# Patient Record
Sex: Female | Born: 1950 | Race: White | Hispanic: No | Smoking: Never smoker
Health system: Southern US, Community
[De-identification: ages and names within clinical notes are randomized; demographics above are authoritative.]

## PROBLEM LIST (undated history)

## (undated) DIAGNOSIS — E079 Disorder of thyroid, unspecified: Secondary | ICD-10-CM

## (undated) DIAGNOSIS — J45909 Unspecified asthma, uncomplicated: Secondary | ICD-10-CM

## (undated) HISTORY — PX: ABDOMINAL HYSTERECTOMY: SHX81

## (undated) HISTORY — PX: HERNIA REPAIR: SHX51

---

## 2013-04-26 ENCOUNTER — Other Ambulatory Visit: Payer: Self-pay | Admitting: Family Medicine

## 2013-04-26 ENCOUNTER — Ambulatory Visit
Admission: RE | Admit: 2013-04-26 | Discharge: 2013-04-26 | Disposition: A | Discharge status: Home / Self Care | Payer: BC Managed Care – PPO | Source: Ambulatory Visit | Attending: Family Medicine | Admitting: Family Medicine

## 2013-04-26 DIAGNOSIS — M25512 Pain in left shoulder: Secondary | ICD-10-CM

## 2013-05-09 ENCOUNTER — Ambulatory Visit: Payer: BC Managed Care – PPO

## 2015-04-10 ENCOUNTER — Encounter (HOSPITAL_COMMUNITY): Payer: Self-pay | Admitting: Emergency Medicine

## 2015-04-10 ENCOUNTER — Emergency Department (HOSPITAL_COMMUNITY)
Admission: EM | Admit: 2015-04-10 | Discharge: 2015-04-11 | Disposition: A | Discharge status: Home / Self Care | Payer: Medicare Other | Attending: Emergency Medicine | Admitting: Emergency Medicine

## 2015-04-10 DIAGNOSIS — Z79899 Other long term (current) drug therapy: Secondary | ICD-10-CM | POA: Diagnosis not present

## 2015-04-10 DIAGNOSIS — Y9301 Activity, walking, marching and hiking: Secondary | ICD-10-CM | POA: Diagnosis not present

## 2015-04-10 DIAGNOSIS — J45909 Unspecified asthma, uncomplicated: Secondary | ICD-10-CM | POA: Diagnosis not present

## 2015-04-10 DIAGNOSIS — Z7951 Long term (current) use of inhaled steroids: Secondary | ICD-10-CM | POA: Diagnosis not present

## 2015-04-10 DIAGNOSIS — S2220XA Unspecified fracture of sternum, initial encounter for closed fracture: Secondary | ICD-10-CM | POA: Diagnosis not present

## 2015-04-10 DIAGNOSIS — S299XXA Unspecified injury of thorax, initial encounter: Secondary | ICD-10-CM | POA: Diagnosis present

## 2015-04-10 DIAGNOSIS — Y998 Other external cause status: Secondary | ICD-10-CM | POA: Diagnosis not present

## 2015-04-10 DIAGNOSIS — Y9289 Other specified places as the place of occurrence of the external cause: Secondary | ICD-10-CM | POA: Diagnosis not present

## 2015-04-10 DIAGNOSIS — W108XXA Fall (on) (from) other stairs and steps, initial encounter: Secondary | ICD-10-CM | POA: Diagnosis not present

## 2015-04-10 DIAGNOSIS — Z880 Allergy status to penicillin: Secondary | ICD-10-CM | POA: Diagnosis not present

## 2015-04-10 DIAGNOSIS — E079 Disorder of thyroid, unspecified: Secondary | ICD-10-CM | POA: Insufficient documentation

## 2015-04-10 HISTORY — DX: Disorder of thyroid, unspecified: E07.9

## 2015-04-10 HISTORY — DX: Unspecified asthma, uncomplicated: J45.909

## 2015-04-10 NOTE — ED Notes (Signed)
Pt fell Thursday and thinks she fell on her chest and chin. Fall was mechanical. Says pain has worsened (centralized to middle of chest) since then. Described as "stabbing." Endorses knee pain with abrasion noted. No other c/c. RR even/unlabored.

## 2015-04-11 ENCOUNTER — Emergency Department (HOSPITAL_COMMUNITY): Payer: Medicare Other

## 2015-04-11 ENCOUNTER — Encounter (HOSPITAL_COMMUNITY): Payer: Self-pay

## 2015-04-11 DIAGNOSIS — S2220XA Unspecified fracture of sternum, initial encounter for closed fracture: Secondary | ICD-10-CM | POA: Diagnosis not present

## 2015-04-11 LAB — I-STAT CHEM 8, ED
BUN: 27 mg/dL — ABNORMAL HIGH (ref 6–20)
CALCIUM ION: 1.19 mmol/L (ref 1.13–1.30)
CHLORIDE: 104 mmol/L (ref 101–111)
Creatinine, Ser: 0.7 mg/dL (ref 0.44–1.00)
GLUCOSE: 146 mg/dL — AB (ref 65–99)
HCT: 37 % (ref 36.0–46.0)
Hemoglobin: 12.6 g/dL (ref 12.0–15.0)
Potassium: 4.2 mmol/L (ref 3.5–5.1)
SODIUM: 142 mmol/L (ref 135–145)
TCO2: 25 mmol/L (ref 0–100)

## 2015-04-11 MED ORDER — ONDANSETRON HCL 4 MG/2ML IJ SOLN
4.0000 mg | Freq: Once | INTRAMUSCULAR | Status: AC
  Administered 2015-04-11: 4 mg via INTRAVENOUS
  Filled 2015-04-11: qty 2

## 2015-04-11 MED ORDER — IBUPROFEN 800 MG PO TABS: 800.0000 mg | ORAL_TABLET | Freq: Three times a day (TID) | ORAL | Status: AC | PRN

## 2015-04-11 MED ORDER — SODIUM CHLORIDE 0.9 % IV BOLUS (SEPSIS)
1000.0000 mL | Freq: Once | INTRAVENOUS | Status: AC
  Administered 2015-04-11: 1000 mL via INTRAVENOUS

## 2015-04-11 MED ORDER — OXYCODONE-ACETAMINOPHEN 5-325 MG PO TABS: 1.0000 | ORAL_TABLET | Freq: Four times a day (QID) | ORAL | Status: AC | PRN

## 2015-04-11 MED ORDER — MORPHINE SULFATE (PF) 4 MG/ML IV SOLN
4.0000 mg | Freq: Once | INTRAVENOUS | Status: AC
  Administered 2015-04-11: 4 mg via INTRAVENOUS
  Filled 2015-04-11: qty 1

## 2015-04-11 MED ORDER — HYDROMORPHONE HCL 1 MG/ML IJ SOLN
1.0000 mg | Freq: Once | INTRAMUSCULAR | Status: AC
  Administered 2015-04-11: 1 mg via INTRAVENOUS
  Filled 2015-04-11: qty 1

## 2015-04-11 MED ORDER — IOHEXOL 300 MG/ML  SOLN
75.0000 mL | Freq: Once | INTRAMUSCULAR | Status: AC | PRN
  Administered 2015-04-11: 75 mL via INTRAVENOUS

## 2015-04-11 MED ORDER — DOCUSATE SODIUM 100 MG PO CAPS: 100.0000 mg | ORAL_CAPSULE | Freq: Two times a day (BID) | ORAL | Status: AC

## 2015-04-11 NOTE — ED Provider Notes (Signed)
By signing my name below, I, Budd Palmer, attest that this documentation has been prepared under the direction and in the presence of [employer], DO. Electronically Signed: Budd Palmer, ED Scribe. 04/11/2015. 2:20 AM.  TIME SEEN: 2:06 AM  CHIEF COMPLAINT: Fall, Chest injury  HPI: Adrienne Holder is a 64 y.o. female with a PMHx of asthma and thyroid disease who presents to the Emergency Department complaining of a painful, gradually worsening chest injury sustained after a fall that occurred 8 days ago. Pt states she was walking in the rain down a few steps when she fell forward. She reports she was trying to avoid landing on her left arm due to a PSHx on the rotator cuff, and landed on her chest and chin instead. She states it felt as though her "heart was in [her] lower back." She notes a bystander helped her to stand back up, after which she was a little dizzy, which resolved. She notes she was ambulatory at the time. She reports associated worsening central chest pain, bruising to the upper chest (noticed 5 days ago), and a painful abrasion to the left knee. She notes exacerbation of the chest pain with deep breathing and movement. She also states that she has a PMHx of lower back pain which has been at baseline. She also reports a PMHx of overactive bladder, for which she has to wear pads. Pt denies urinary or bowel incontinence, hematuria, and difficulty urinating. No numbness, tingling or focal weakness. Is able to ambulate. Feels like it hurts to take a deep breath.   Pt is allergic to penicillins and sulfa antibiotics.  ROS: See HPI Constitutional: no fever  Eyes: no drainage  ENT: no runny nose   Cardiovascular:  chest pain  Resp: no SOB  GI: no vomiting GU: no dysuria Integumentary: no rash  Allergy: no hives  Musculoskeletal: no leg swelling  Neurological: no slurred speech ROS otherwise negative  PAST MEDICAL HISTORY/PAST SURGICAL HISTORY:  Past Medical History   Diagnosis Date  . Asthma   . Thyroid disease     MEDICATIONS:  Prior to Admission medications   Medication Sig Start Date End Date Taking? Authorizing Provider  acetaminophen (TYLENOL) 500 MG tablet Take 1,000 mg by mouth every 6 (six) hours as needed for mild pain.   Yes Historical Provider, MD  albuterol (PROVENTIL HFA;VENTOLIN HFA) 108 (90 BASE) MCG/ACT inhaler Inhale 1-2 puffs into the lungs every 6 (six) hours as needed for wheezing or shortness of breath.    Yes Historical Provider, MD  cetirizine (ZYRTEC) 10 MG tablet Take 10 mg by mouth daily.   Yes Historical Provider, MD  citalopram (CELEXA) 40 MG tablet Take 1 tablet by mouth daily. 01/26/15  Yes Historical Provider, MD  fluticasone (FLONASE) 50 MCG/ACT nasal spray Place 2 sprays into both nostrils daily.   Yes Historical Provider, MD  Fluticasone Furoate-Vilanterol (BREO ELLIPTA) 100-25 MCG/INH AEPB Inhale 1 puff into the lungs daily.   Yes Historical Provider, MD  levothyroxine (SYNTHROID, LEVOTHROID) 50 MCG tablet Take 1 tablet by mouth daily. 03/07/15  Yes Historical Provider, MD  vitamin C (ASCORBIC ACID) 500 MG tablet Take 500 mg by mouth daily.   Yes Historical Provider, MD    ALLERGIES:  Allergies  Allergen Reactions  . Penicillins Hives    Has patient had a PCN reaction causing immediate rash, facial/tongue/throat swelling, SOB or lightheadedness with hypotension: YesYes Has patient had a PCN reaction causing severe rash involving mucus membranes or skin necrosis: NoNo  Has patient had a PCN reaction that required hospitalization NoNo Has patient had a PCN reaction occurring within the last 10 years: NoNo If all of the above answers are "NO", then may proceed with Cephalosporin  . Sulfa Antibiotics Hives    SOCIAL HISTORY:  Social History  Substance Use Topics  . Smoking status: Never Smoker   . Smokeless tobacco: Not on file  . Alcohol Use: No    FAMILY HISTORY: History reviewed. No pertinent family  history.  EXAM: BP 134/92 mmHg  Pulse 59  Temp(Src) 98 F (36.7 C) (Oral)  Resp 18  SpO2 95% CONSTITUTIONAL: Alert and oriented and responds appropriately to questions. Well-appearing; well-nourished HEAD: Normocephalic, atraumatic EYES: Conjunctivae clear, PERRL ENT: normal nose; no rhinorrhea; moist mucous membranes; pharynx without lesions noted NECK: Supple, no meningismus, no LAD, no midline spinal tenderness or step-off or deformity  CARD: RRR; S1 and S2 appreciated; no murmurs, no clicks, no rubs, no gallops RESP: Normal chest excursion without splinting or tachypnea; breath sounds clear and equal bilaterally; no wheezes, no rhonchi, no rales, no hypoxia or respiratory distress, speaking full sentences CHEST: ecchymosis along the anterior chest wall, TTP diffusely across the anterior chest wall, no crepitus, ecchymosis or deformity ABD/GI: Normal bowel sounds; non-distended; soft, non-tender, no rebound, no guarding, no peritoneal signs BACK:  The back appears normal, there is no CVA tenderness, tender over the lower lumbar spine without step-off or deformity EXT: Normal ROM in all joints; non-tender to palpation; no edema; normal capillary refill; no cyanosis, no calf tenderness or swelling    SKIN: Normal color for age and race; warm; abrasion to the left anterior knee  NEURO: Moves all extremities equally, sensation to light touch intact diffusely, cranial nerves II through XII intact, normal gait PSYCH: The patient's mood and manner are appropriate. Grooming and personal hygiene are appropriate.  MEDICAL DECISION MAKING: Patient here with mechanical fall 8 days ago. Is complaining of chest pain, left knee pain, lower back pain. She does appear very uncomfortable on exam. X-ray of her chest shows no acute abnormality but cannot rule out sternal fracture and x-ray of the left knee shows arthritic changes. She states her tetanus is up-to-date. She is neurovascularly intact distally.  Will provide pain medication below also obtain a CT of her chest for further evaluation and also CT of her head and cervical spine given after she has distracting injuries.  ED PROGRESS:   Patient's sodium is 142, potassium 4.2, chloride 104, calcium 1.19, bicarbonate 25, glucose 146, BUN 27, creatinine 0.7, hematocrit 37, hemoglobin 12.6.   Patient CT head and cervical spine show no acute injury. CT chest shows minimally angulated sternal fracture with no other acute traumatic thoracic pathology. Given this injury occurred 8 days ago I do not feel she needs admission at this time. EKG shows no arrhythmia, ischemic changes. She is hemodynamically stable. We'll discharge with pain medication as well as an incentive spirometer. She has had serial abdominal exams which have all been normal. X-ray of her lumbar spine is negative. She feels better after morphine and Dilaudid. Able to ambulate without any difficulty. Discussed return precautions. She states that she lives in [facility] and will follow-up with her PCP next week. She is here visiting family.   9:15 AM  Patient CT scan was addended to state that showed a 5 mm right lower lobe pulmonary nodule. I have contacted patient by phone to let her know these results. She is not a smoker and has  no family history of cancer. Recommended repeat CT scan for follow-up as an outpatient in 12 months. She verbalized understanding.         EKG Interpretation  Date/Time:  Wednesday April 11 2015 06:47:55 EST Ventricular Rate:  59 PR Interval:  155 QRS Duration: 79 QT Interval:  435 QTC Calculation: 431 R Axis:   16 Text Interpretation:  Sinus rhythm Consider left atrial enlargement Low voltage, precordial leads No old tracing to compare Confirmed by Queen Abbett,  DO, Joziah Dollins ([id number]) on 04/11/2015 6:52:45 AM          I personally performed the services described in this documentation, which was scribed in my presence. The recorded information  has been reviewed and is accurate.   Layla MawKristen N Wilver Tignor, DO 04/11/15 [phone]

## 2015-04-11 NOTE — ED Notes (Signed)
Gave pt incentive spirometer and did teaching, pt able to do return demonstration.  Pt helped to get dressed

## 2015-04-11 NOTE — ED Notes (Signed)
Pt ready and waiting for son in law to pick her up

## 2015-04-11 NOTE — ED Notes (Addendum)
Per Alcario DroughtErica RN,  Pt's son-in-law will be here to pick up Pt between [phone].

## 2015-04-11 NOTE — Discharge Instructions (Signed)
Please use your incentive spirometer every hour while awake for the next 2-3 weeks. Please do not lift anything over 20 pounds for the next 4 weeks. Please follow-up with your primary care physician for pain management.   Sternal Fracture The sternum is the bone in the center of the front of your chest which your ribs attach to. It is also called the breastbone. The most common cause of a sternal fracture (break in the bone) is an injury. The most common injury is from a motor vehicle accident. The fracture often comes from the seat belt or hitting the chest on the steering wheel or being forcibly bent forward (shoulders toward your knees) during an accident. It is more common in females and the elderly. The fracture of the sternum is usually not a problem if there are no other injuries. Other injuries that may happen are to the ribs, heart, lungs, and abdominal organs. SYMPTOMS  Common complaints from a fracture of the sternum include:  Shortness of breath.  Pain with breathing or difficulty breathing.  Bruises about the chest.  Tenderness or a cracking sound at the breastbone. DIAGNOSIS  Your caregiver may be able to tell if the sternum is broken by examining you. Other times studies such as X-ray, CAT scan, ultrasound, and nuclear medicine are used to detect a fracture.  TREATMENT   Sternal fractures usually are not serious and if displacement is minimal, no treatment is necessary.  The main concern is with damage to the surrounding structures: ribs, heart, great vessels coming from the heart, and the backbone in the chest area.  Multiple rib fractures may cause breathing difficulties.  Injury to one of the large vessels in the chest may be a threat to life and require immediate surgery.  If injury to the heart or lungs is suspected it may be necessary to stay in the hospital and be monitored.  Other injuries will be treated as needed.  If the pieces of the breastbone are out of  normal position, they may need to be reduced (put back in position) and then wired in place or fixed with a plate and screws during an operation. HOME CARE INSTRUCTIONS   Avoid strenuous activity. Be careful during activities and avoid bumping or reinjuring the injured sternum. Activities that cause pain pull on the fracture site(s) and are best avoided if possible.  Eat a normal, well-balanced diet. Drink plenty of fluids to avoid constipation, a common side effect of pain medications.  Take deep breaths and cough several times a day, splinting the injured area with a pillow. This will help prevent pneumonia.  Do not wear a rib belt or binder for the chest unless instructed otherwise. These restrict breathing and can lead to pneumonia.  Only take over-the-counter or prescription medicines for pain, discomfort, or fever as directed by your caregiver. SEEK MEDICAL CARE IF:  You develop a continual cough, associated with thick or bloody mucus or phlegm (sputum). SEEK IMMEDIATE MEDICAL CARE IF:   You have a fever.  You have increasing difficulty breathing.  You feel sick to your stomach (nausea), vomit, or have abdominal pain.  You have worsening pain, not controlled with medications.  You develop pain in the tops of your shoulders (in the shoulder strap area).  You feel light-headed or faint.  You develop chest pain or an abnormal heartbeat (palpitations).  You develop pain radiating into the jaw, teeth or down the arms.   This information is not intended to replace advice  given to you by your health care provider. Make sure you discuss any questions you have with your health care provider.   Document Released: 12/04/2003 Document Revised: 05/12/2014 Document Reviewed: 11/15/2014 Elsevier Interactive Patient Education [employer]2016 [employer]

## 2015-04-11 NOTE — ED Notes (Signed)
Patient resting in bed with eyes closed, respirations even and non-labored, skin warm and dry.

## 2016-12-14 IMAGING — CR DG CHEST 2V
3 series · 3 of 3 positions shown · non-contrast
Comparison: None.

CLINICAL DATA: Status post fall last week in parking lot, landing
on chest. Sternal pain and shortness of breath. Initial encounter.

EXAM:
CHEST  2 VIEW

[w chest lat (1 of 2)]
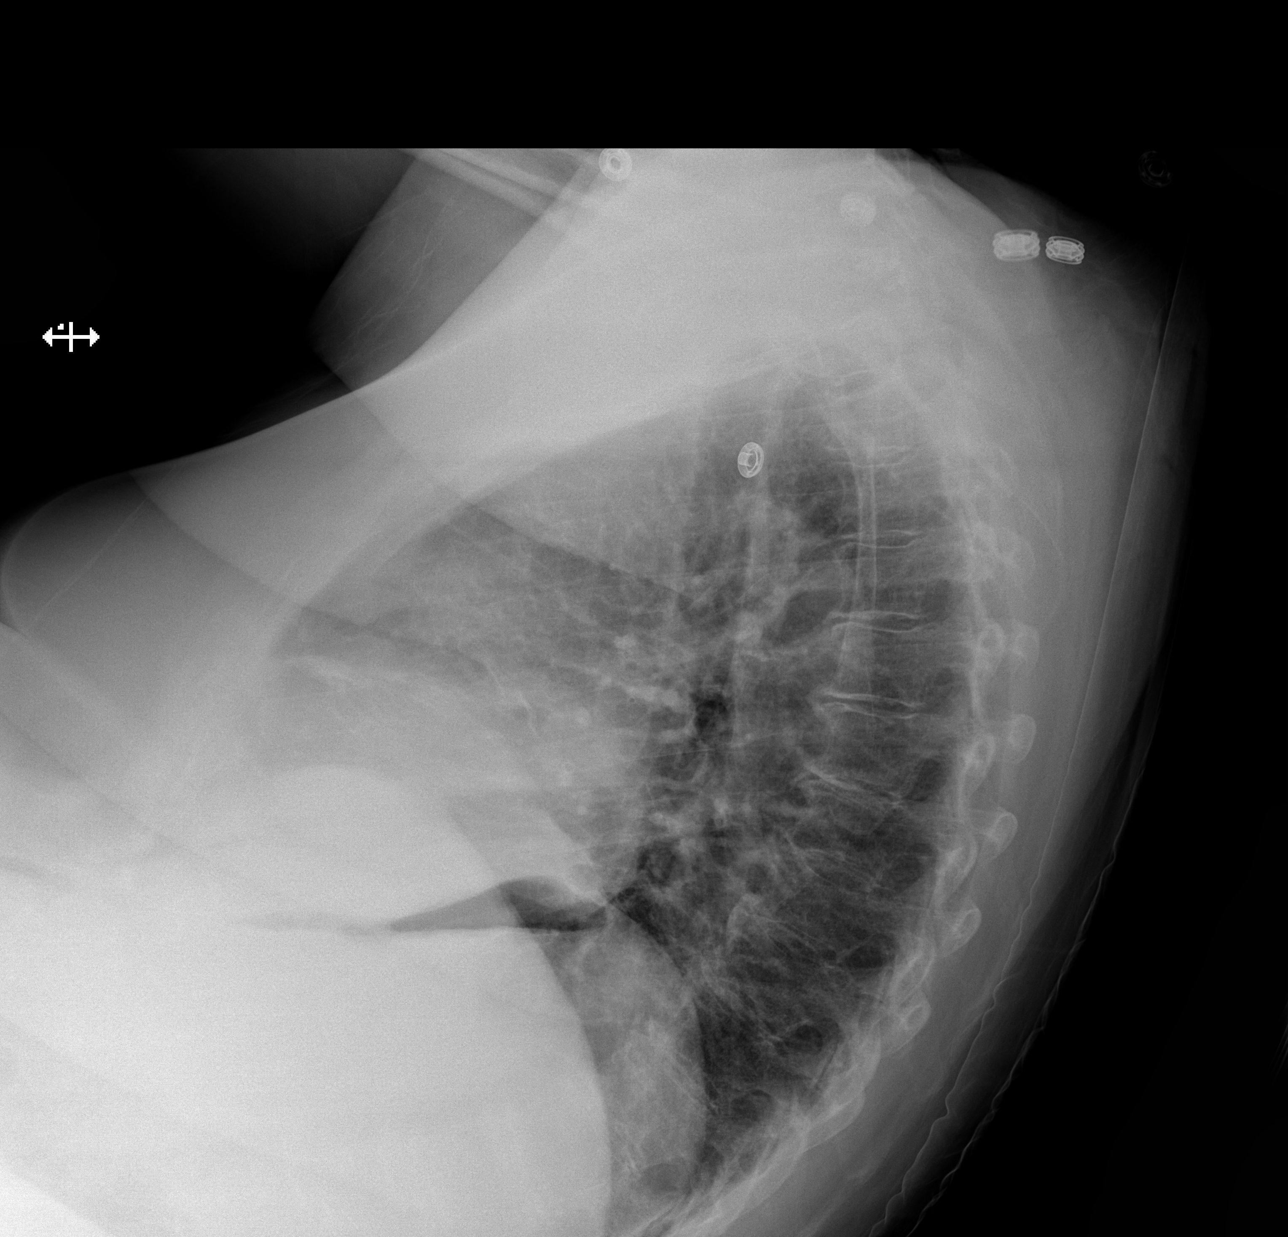

[w chest lat (2 of 2)]
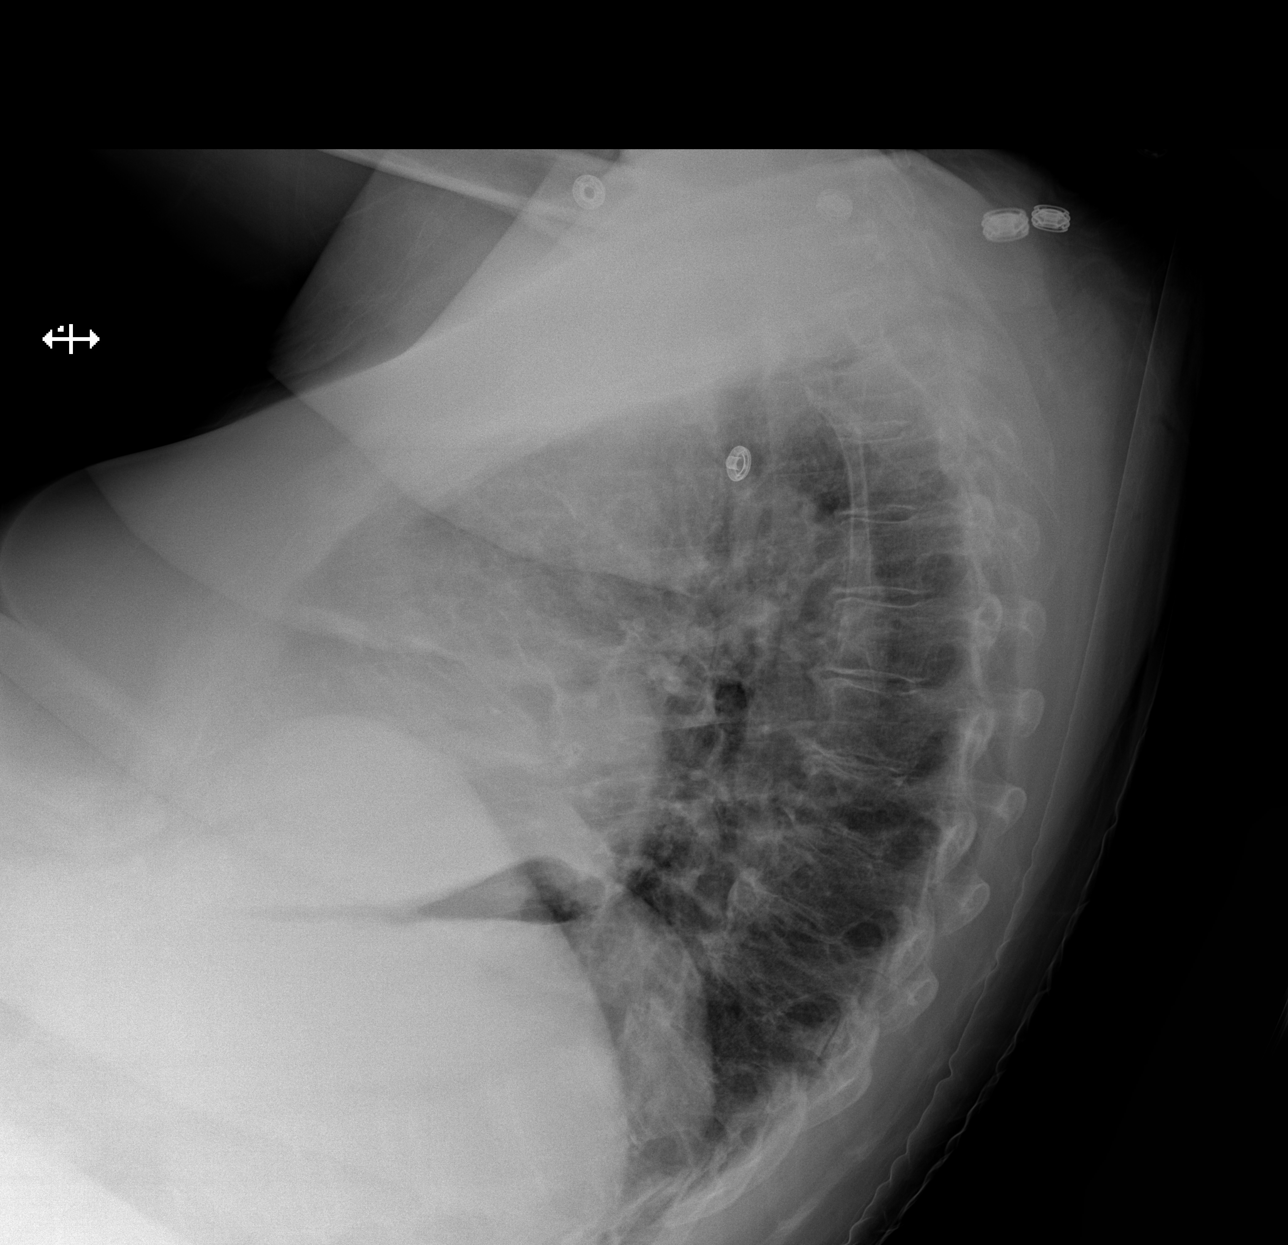

[x chest ap]
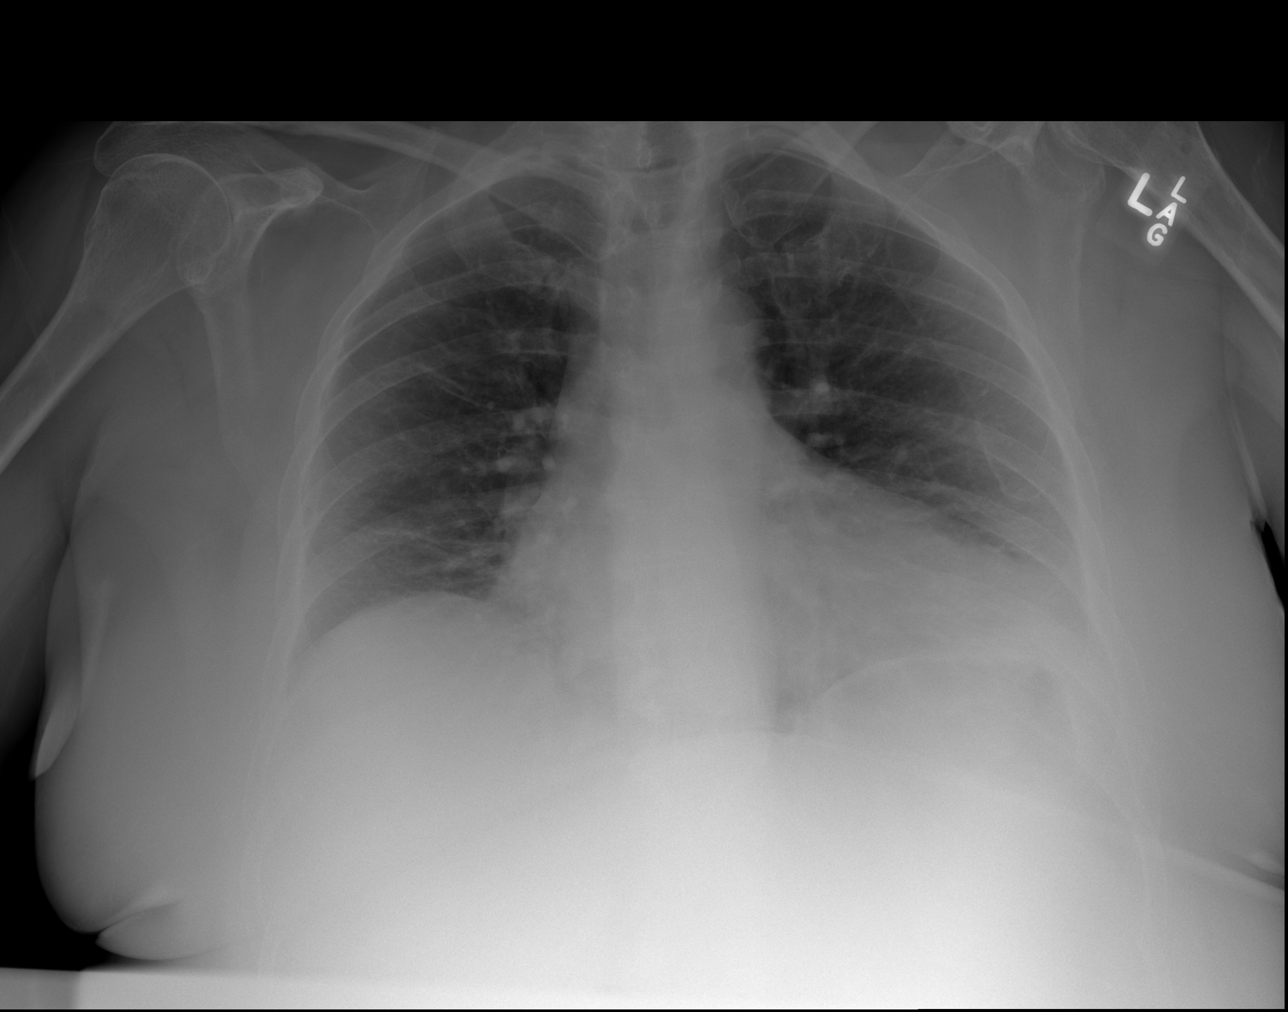

[3 of 3 positions shown; findings below may reference images not displayed]

FINDINGS: The lungs are well-aerated. Minimal left basilar atelectasis is
noted. There is no evidence of pleural effusion or pneumothorax.

The heart is enlarged. The sternum is not well assessed, and a
sternal fracture cannot be excluded on the basis of this study.
IMPRESSION: 1. Minimal left basilar atelectasis noted.  Lungs otherwise clear.
2. Cardiomegaly noted.
3. The sternum is not well assessed. A sternal fracture cannot be
excluded on the basis of this study.

## 2016-12-14 IMAGING — CR DG LUMBAR SPINE COMPLETE 4+V
5 series · 5 of 5 positions shown · non-contrast
Comparison: None.

CLINICAL DATA: Status post fall 1 week ago, with lower back pain.
Initial encounter.

EXAM:
LUMBAR SPINE - COMPLETE 4+ VIEW

[t lumbar spine ap]
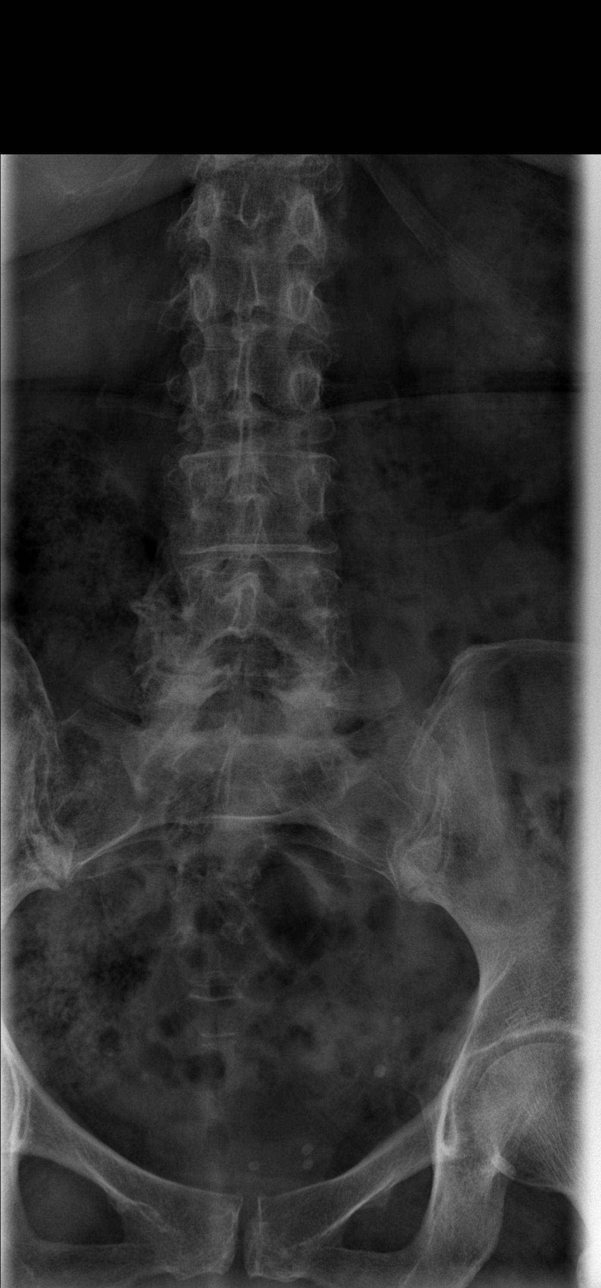

[t lumbar spine obl (1 of 2)]
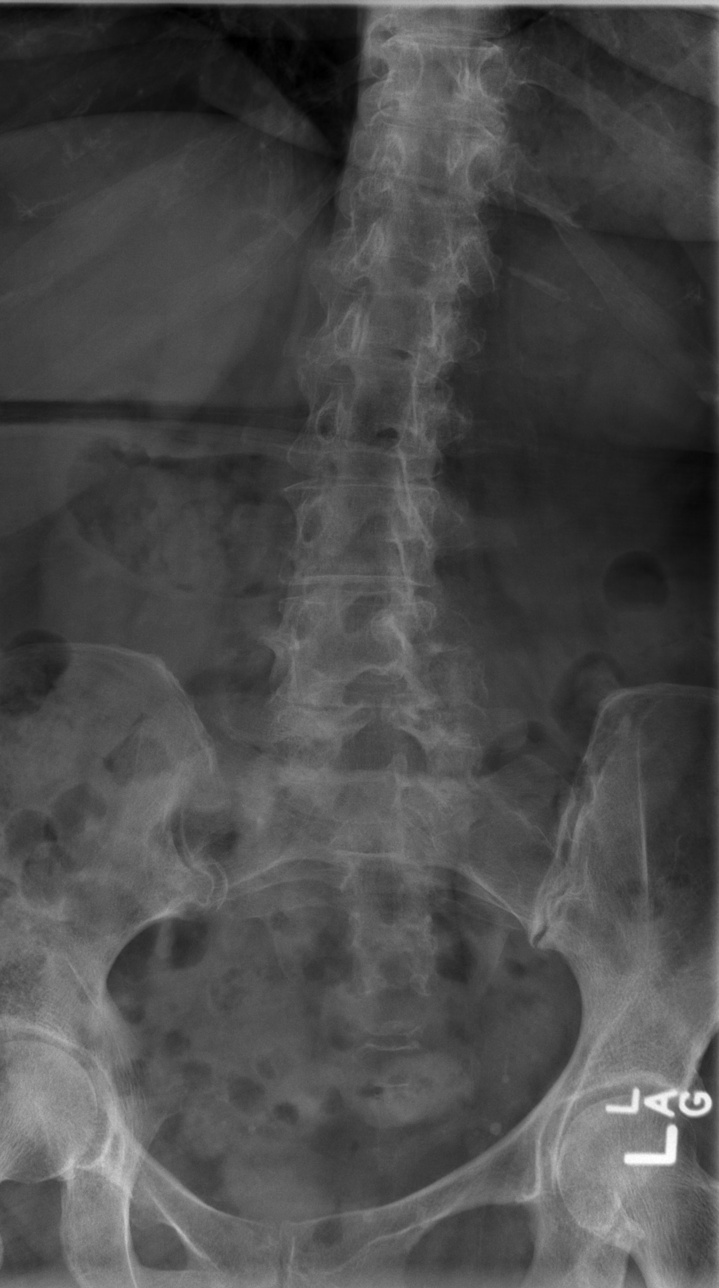

[t lumbar spine obl (2 of 2)]
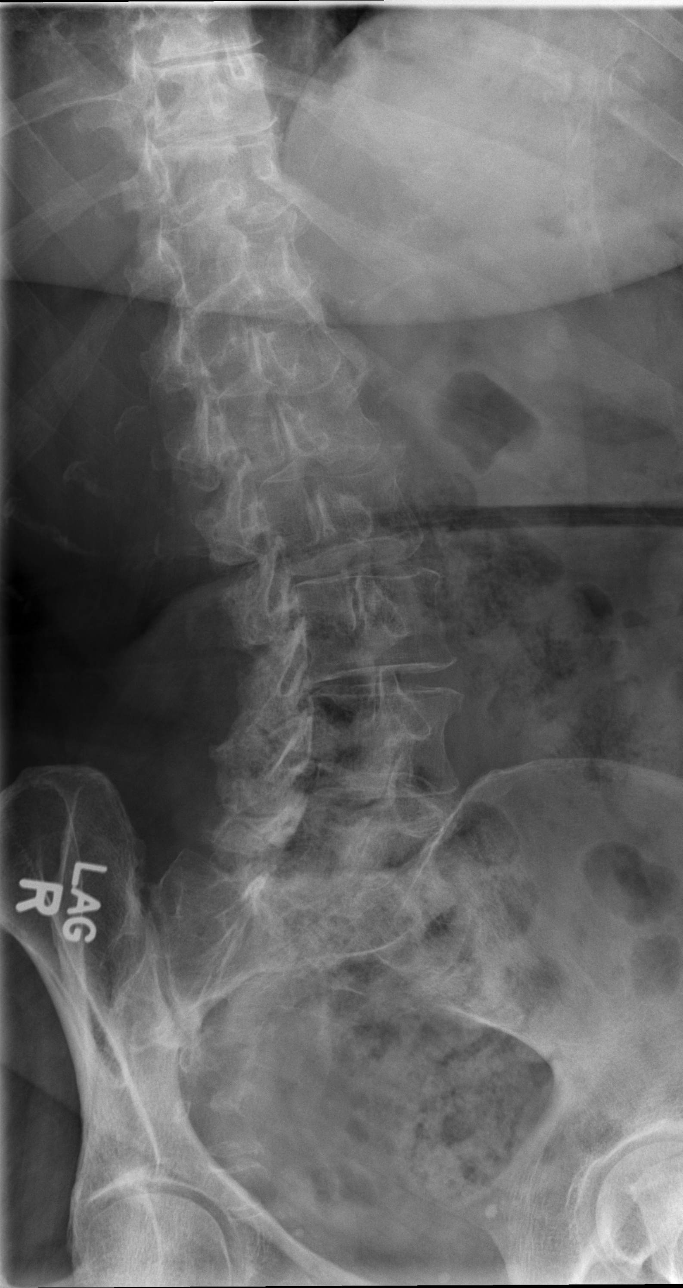

[t lumbar spine lat]
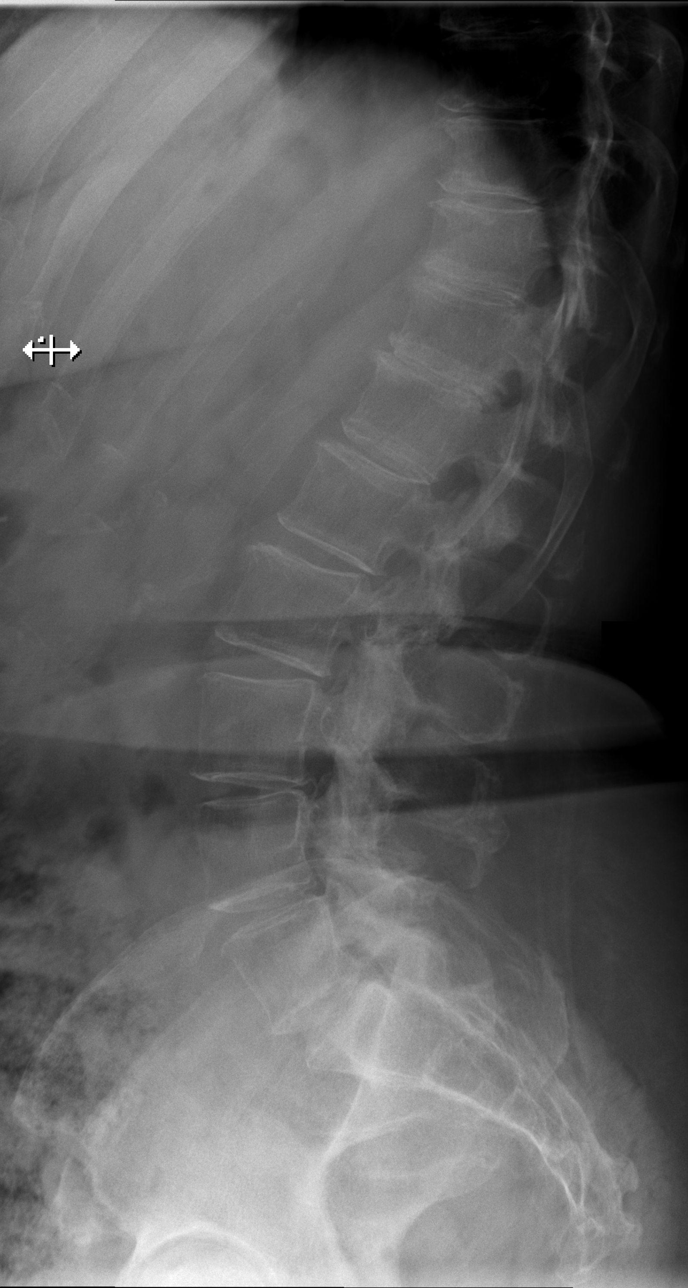

[t lumbar l-5 s-1 spot]
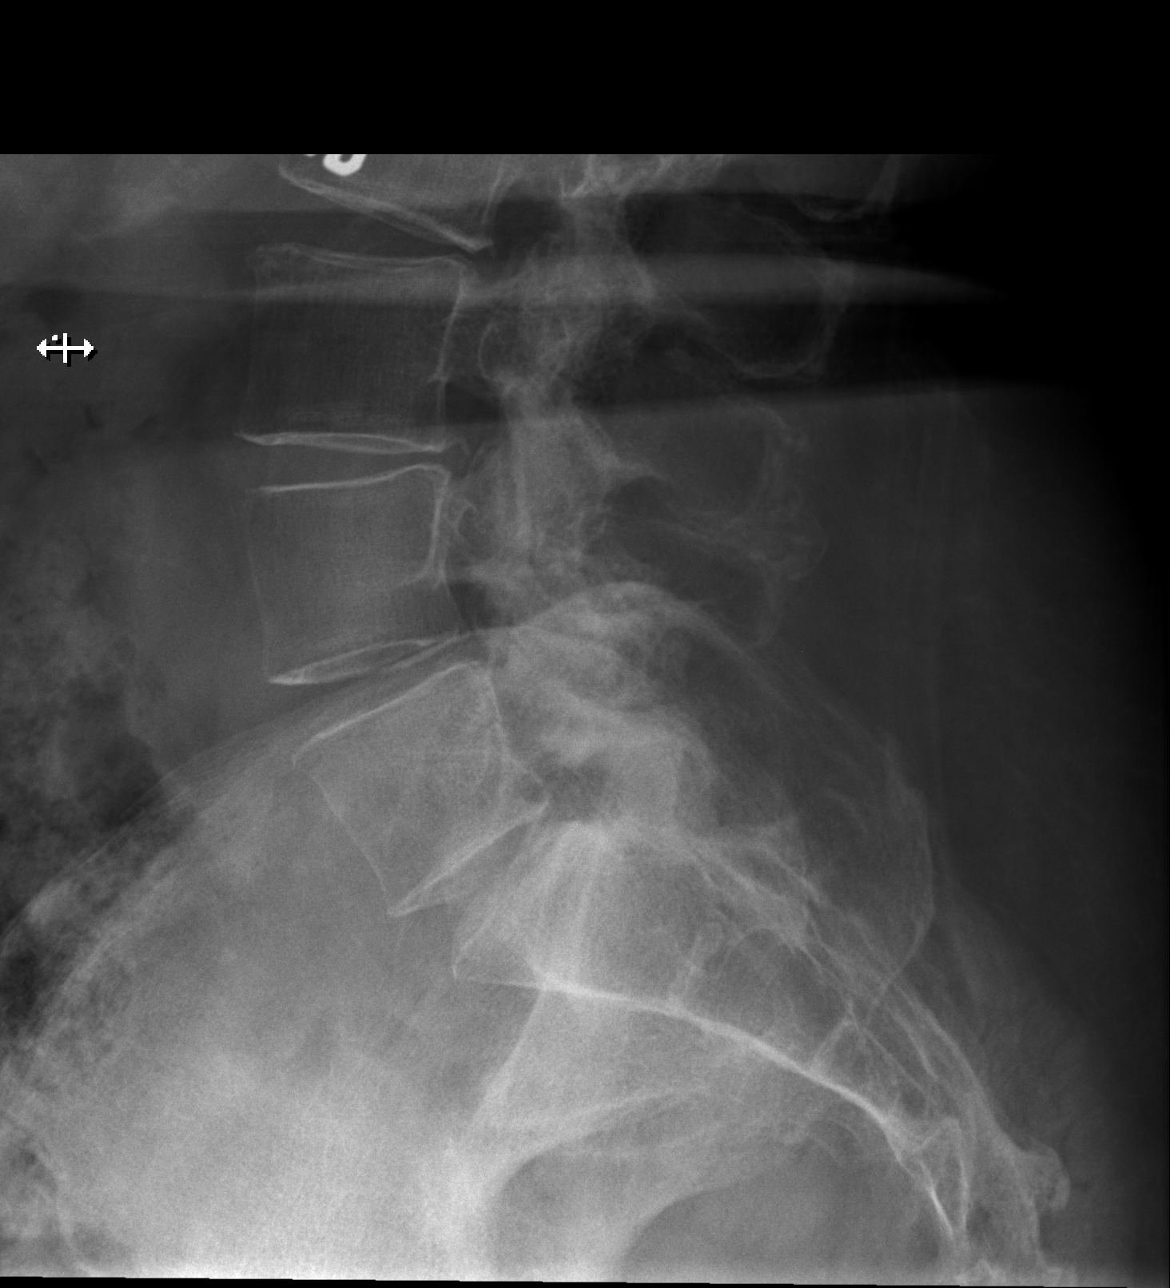

[5 of 5 positions shown; findings below may reference images not displayed]

FINDINGS: There is no evidence of fracture or subluxation. Vertebral bodies
demonstrate normal height and alignment. Intervertebral disc spaces
are preserved. Facet disease is noted along the lower lumbar spine.

The visualized bowel gas pattern is unremarkable in appearance; air
and stool are noted within the colon. The sacroiliac joints are
within normal limits.
IMPRESSION: No evidence of fracture or subluxation along the lumbar spine.
